# Patient Record
Sex: Female | Born: 1985 | Race: White | Hispanic: No | Marital: Single | Smoking: Current every day smoker
Health system: Southern US, Community
[De-identification: ages and names within clinical notes are randomized; demographics above are authoritative.]

---
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
## 2004-08-04 ENCOUNTER — Emergency Department (HOSPITAL_COMMUNITY): Admission: EM | Admit: 2004-08-04 | Discharge: 2004-08-04 | Payer: Self-pay | Admitting: Emergency Medicine

## 2010-01-06 ENCOUNTER — Emergency Department (HOSPITAL_COMMUNITY): Admission: EM | Admit: 2010-01-06 | Discharge: 2010-01-06 | Payer: Self-pay | Admitting: Emergency Medicine

## 2010-08-02 NOTE — Consult Note (Signed)
[facility]  Patient:    Diane Franklin, Diane Franklin Visit Number: [id number] MRN: [id number]          Service Type: EMS Location: ED Attending Physician:  Cassell Smiles. Dictated by:   Langley Gauss, M.D. Proc. Date: 01/24/01 Admit Date:  01/24/2001   CC:         Elfredia Nevins, M.D., [facility]   Consultation Report  REASON FOR CONSULTATION:  This is a 25 year old gravida 1, para 0, with a last menstrual period at the end of September or early part of October, who presents to the emergency room on todays date, presenting of uterine cramping and vaginal bleeding x 3 days duration.  She describes the uterine cramping and bleeding as being heavier than typical menses.  This actually subsided somewhat.  Menstrual periods are typically irregular.  She states that the bleeding and cramping were very severe Friday, forcing her to leave school early, then again today, the bleeding was very heavy with increased cramping, which have markedly improved upon my evaluation.  Patient states she is status post several small clots about quarter-sized.  She also describes passage of a long stringy clot x 1.  SOCIAL HISTORY:  Patients mother is very upset.  She obviously is unaware of the patients sexual activity and today was the first finding of a positive pregnancy test.  Patient is a 10th grade student at ______ [employer].  MEDICAL HISTORY:  No prior medical or surgical history.  ALLERGIES:  No known drug allergies.  PHYSICAL EXAMINATION:  GENERAL:  Patient in no acute distress, somewhat tearful, as is her mother.  VITAL SIGNS:  Temperature 97.7, pulse of 75, respiratory rate is 18, blood pressure is 109/50.  HEENT:  Negative.  NECK:  No adenopathy.  Neck is supple.  Thyroid is nonpalpable.  LUNGS:  Clear.  CARDIOVASCULAR:  Regular rate and rhythm.  ABDOMEN:  Soft and nontender.  No surgical scars are identified.  EXTREMITIES:   Normal.  PELVIC:  Exam reveals a small amount of vaginal bleeding present on the inner thighs.  Sterile speculum examination is performed.  There is a membranous-appearing tissue present within vaginal vault.  This is teased away until completely removed from the endocervix.  A culture is then performed of the endocervical os.  The endocervical os is noted to be closed.  Bimanual examination reveals a normal-sized uterus, nontender, no adnexal masses.  LABORATORY STUDIES:  A quantitative beta hCG is 781.  Blood type is AB-positive.  Electrolytes within normal limits.  Serum hCG is positive. Hemoglobin 13.7, hematocrit 39.5, white blood cell count of 8.4.  Urinalysis pertinent only for moderate hemoglobin.  ASSESSMENT:  Early intrauterine pregnancy, first trimester, best estimated gestational age would be 5 to 6 weeks, patient now with vaginal bleeding and cramping x 3 days duration, markedly decreased at this point in time with passage in the emergency room of what appears to be tissue.  This is sent off for products of conception.  Signs and symptoms of complete spontaneous abortion are reviewed with the patient and her mother.  Should the bleeding or cramping increase, they may require reevaluation to include possible ultrasound, repeat quantitative human chorionic gonadotropin and dilatation and curettage if clinically indicated.  Otherwise, will follow up at the office in two weeks time.  Did discuss briefly with the patient and her mother that the initiation of birth control may be desirable at that time. All questions are answered.  Patient discharged home at this  time.  Patient initially seen in the emergency room by Dr. Elfredia Nevins, who did the preliminary evaluation and then consulted me for continuing evaluation and management of this the patient. Dictated by:   Langley Gauss, M.D. Attending Physician:  Cassell Smiles DD:  01/24/01 TD:  01/25/01 Job:  [id number]

## 2011-09-09 ENCOUNTER — Ambulatory Visit (HOSPITAL_COMMUNITY)
Admission: RE | Admit: 2011-09-09 | Discharge: 2011-09-09 | Disposition: A | Discharge status: Home / Self Care | Payer: 59 | Source: Ambulatory Visit | Attending: Family Medicine | Admitting: Family Medicine

## 2011-09-09 ENCOUNTER — Other Ambulatory Visit: Payer: Self-pay | Admitting: Family Medicine

## 2011-09-09 DIAGNOSIS — Z9289 Personal history of other medical treatment: Secondary | ICD-10-CM

## 2011-09-09 DIAGNOSIS — R042 Hemoptysis: Secondary | ICD-10-CM | POA: Insufficient documentation

## 2011-09-09 DIAGNOSIS — Z228 Carrier of other infectious diseases: Secondary | ICD-10-CM | POA: Insufficient documentation

## 2011-09-24 ENCOUNTER — Other Ambulatory Visit: Payer: Self-pay | Admitting: Family Medicine

## 2011-09-24 DIAGNOSIS — R042 Hemoptysis: Secondary | ICD-10-CM

## 2011-10-01 ENCOUNTER — Ambulatory Visit (HOSPITAL_COMMUNITY)
Admission: RE | Admit: 2011-10-01 | Discharge: 2011-10-01 | Disposition: A | Discharge status: Home / Self Care | Payer: 59 | Source: Ambulatory Visit | Attending: Family Medicine | Admitting: Family Medicine

## 2011-10-01 DIAGNOSIS — R042 Hemoptysis: Secondary | ICD-10-CM

## 2011-10-01 MED ORDER — IOHEXOL 300 MG/ML  SOLN
80.0000 mL | Freq: Once | INTRAMUSCULAR | Status: AC | PRN
  Administered 2011-10-01: 80 mL via INTRAVENOUS

## 2012-12-01 ENCOUNTER — Ambulatory Visit (INDEPENDENT_AMBULATORY_CARE_PROVIDER_SITE_OTHER): Payer: BC Managed Care – PPO | Admitting: Nurse Practitioner

## 2012-12-01 ENCOUNTER — Encounter: Payer: Self-pay | Admitting: Family Medicine

## 2012-12-01 ENCOUNTER — Encounter: Payer: Self-pay | Admitting: Nurse Practitioner

## 2012-12-01 VITALS — BP 108/68 | Temp 98.7°F | Ht 62.0 in | Wt 118.8 lb

## 2012-12-01 DIAGNOSIS — J322 Chronic ethmoidal sinusitis: Secondary | ICD-10-CM

## 2012-12-01 MED ORDER — AZITHROMYCIN 250 MG PO TABS: ORAL_TABLET | ORAL | Status: DC

## 2012-12-01 NOTE — Patient Instructions (Signed)
Loratadine 10 mg once in the morning Benadryl at night Nasacort AQ as directed

## 2012-12-03 ENCOUNTER — Encounter: Payer: Self-pay | Admitting: Nurse Practitioner

## 2012-12-03 ENCOUNTER — Encounter: Payer: Self-pay | Admitting: Family Medicine

## 2012-12-03 NOTE — [facility] Notes (Signed)
Subjective:  Presents for complaints of ethmoid sinus area pressure over the past week. Patient is a smoker. No fever. No cough. No sore throat ear pain. Has been taking Benadryl.  Objective:   BP 108/68  Temp(Src) 98.7 F (37.1 C)  Ht 5\' 2"  (1.575 m)  Wt 118 lb 12.8 oz (53.887 kg)  BMI 21.72 kg/m2 NAD. Alert, oriented. TMs clear effusion, no erythema. Pharynx injected was slightly green PND noted. Neck supple mild soft nontender adenopathy. Lungs clear. Heart regular rate rhythm.  Assessment: Ethmoid sinusitis  Plan:. Meds ordered this encounter  Medications  . azithromycin (ZITHROMAX Z-PAK) 250 MG tablet    Sig: Take 2 tablets (500 mg) on  Day 1,  followed by 1 tablet (250 mg) once daily on Days 2 through 5.    Dispense:  6 each    Refill:  0    Order Specific Question:  Supervising Provider    Answer:  Merlyn Albert [[id number]]   OTC as directed. Call back if worsens or persists.

## 2013-01-25 ENCOUNTER — Other Ambulatory Visit: Payer: Self-pay | Admitting: *Deleted

## 2013-01-25 ENCOUNTER — Telehealth: Payer: Self-pay | Admitting: *Deleted

## 2013-01-25 MED ORDER — ONDANSETRON HCL 8 MG PO TABS: 8.0000 mg | ORAL_TABLET | Freq: Three times a day (TID) | ORAL | Status: DC | PRN

## 2013-01-25 NOTE — Telephone Encounter (Signed)
Called in Zofran for pt. Told her to get a decongestant as well. Told her to call back if symptoms got worst. Pt verbalized understanding.

## 2013-01-26 ENCOUNTER — Encounter: Payer: Self-pay | Admitting: Family Medicine

## 2013-01-26 ENCOUNTER — Ambulatory Visit (INDEPENDENT_AMBULATORY_CARE_PROVIDER_SITE_OTHER): Payer: BC Managed Care – PPO | Admitting: Family Medicine

## 2013-01-26 VITALS — BP 118/60 | Temp 99.1°F | Ht 62.0 in | Wt 117.0 lb

## 2013-01-26 DIAGNOSIS — J069 Acute upper respiratory infection, unspecified: Secondary | ICD-10-CM

## 2013-01-26 DIAGNOSIS — A084 Viral intestinal infection, unspecified: Secondary | ICD-10-CM

## 2013-01-26 DIAGNOSIS — A088 Other specified intestinal infections: Secondary | ICD-10-CM

## 2013-01-26 NOTE — Patient Instructions (Signed)
You Can Quit Smoking If you are ready to quit smoking or are thinking about it, congratulations! You have chosen to help yourself be healthier and live longer! There are lots of different ways to quit smoking. Nicotine gum, nicotine patches, a nicotine inhaler, or nicotine nasal spray can help with physical craving. Hypnosis, support groups, and medicines help break the habit of smoking. TIPS TO GET OFF AND STAY OFF CIGARETTES  Learn to predict your moods. Do not let a bad situation be your excuse to have a cigarette. Some situations in your life might tempt you to have a cigarette.  Ask friends and co-workers not to smoke around you.  Make your home smoke-free.  Never have "just one" cigarette. It leads to wanting another and another. Remind yourself of your decision to quit.  On a card, make a list of your reasons for not smoking. Read it at least the same number of times a day as you have a cigarette. Tell yourself everyday, "I do not want to smoke. I choose not to smoke."  Ask someone at home or work to help you with your plan to quit smoking.  Have something planned after you eat or have a cup of coffee. Take a walk or get other exercise to perk you up. This will help to keep you from overeating.  Try a relaxation exercise to calm you down and decrease your stress. Remember, you may be tense and nervous the first two weeks after you quit. This will pass.  Find new activities to keep your hands busy. Play with a pen, coin, or rubber band. Doodle or draw things on paper.  Brush your teeth right after eating. This will help cut down the craving for the taste of tobacco after meals. You can try mouthwash too.  Try gum, breath mints, or diet candy to keep something in your mouth. IF YOU SMOKE AND WANT TO QUIT:  Do not stock up on cigarettes. Never buy a carton. Wait until one pack is finished before you buy another.  Never carry cigarettes with you at work or at home.  Keep cigarettes  as far away from you as possible. Leave them with someone else.  Never carry matches or a lighter with you.  Ask yourself, "Do I need this cigarette or is this just a reflex?"  Bet with someone that you can quit. Put cigarette money in a piggy bank every morning. If you smoke, you give up the money. If you do not smoke, by the end of the week, you keep the money.  Keep trying. It takes 21 days to change a habit!  Talk to your doctor about using medicines to help you quit. These include nicotine replacement gum, lozenges, or skin patches. Document Released: 12/28/2008 Document Revised: 05/26/2011 Document Reviewed: 12/28/2008 [employer] Patient Information 2014 [employer].  

## 2013-01-26 NOTE — [facility] Notes (Signed)
  Subjective:    Patient ID: Diane Franklin, female    DOB: [birth year], 27 y.o.   MRN: [id number]  Cough This is a new problem. The current episode started in the past 7 days. Associated symptoms include a fever and headaches. Associated symptoms comments: Vomiting, diarrhea, abdominal pain. Treatments tried: mucinex.   patient relates start off head congestion To nausea vomiting and diarrhea pay congestion and not quite as bad. Past medical history benign   Review of Systems  Constitutional: Positive for fever.  Respiratory: Positive for cough.   Neurological: Positive for headaches.       Objective:   Physical Exam Eardrums are normal throat is normal neck is supple lungs are clear head congestion noted mild sinus tenderness in       Assessment & Plan:  Viral gastroenteritis should resolve on its own Up rest real this could roll into a sinus infection warning signs were discussed if any symptoms of this call us we will call in antibiotic Zofran as needed for nausea Rehydration techniques discussed Warning signs discussed

## 2013-08-21 ENCOUNTER — Encounter (HOSPITAL_COMMUNITY): Payer: Self-pay | Admitting: Emergency Medicine

## 2013-08-21 ENCOUNTER — Emergency Department (HOSPITAL_COMMUNITY): Payer: No Typology Code available for payment source

## 2013-08-21 ENCOUNTER — Emergency Department (HOSPITAL_COMMUNITY)
Admission: EM | Admit: 2013-08-21 | Discharge: 2013-08-21 | Disposition: A | Discharge status: Home / Self Care | Payer: No Typology Code available for payment source | Attending: Emergency Medicine | Admitting: Emergency Medicine

## 2013-08-21 DIAGNOSIS — Z3202 Encounter for pregnancy test, result negative: Secondary | ICD-10-CM | POA: Insufficient documentation

## 2013-08-21 DIAGNOSIS — F17200 Nicotine dependence, unspecified, uncomplicated: Secondary | ICD-10-CM | POA: Insufficient documentation

## 2013-08-21 DIAGNOSIS — R0789 Other chest pain: Secondary | ICD-10-CM

## 2013-08-21 DIAGNOSIS — Z791 Long term (current) use of non-steroidal anti-inflammatories (NSAID): Secondary | ICD-10-CM | POA: Insufficient documentation

## 2013-08-21 DIAGNOSIS — R071 Chest pain on breathing: Secondary | ICD-10-CM | POA: Insufficient documentation

## 2013-08-21 DIAGNOSIS — R0602 Shortness of breath: Secondary | ICD-10-CM | POA: Insufficient documentation

## 2013-08-21 LAB — BASIC METABOLIC PANEL
BUN: 11 mg/dL (ref 6–23)
CHLORIDE: 101 meq/L (ref 96–112)
CO2: 26 mEq/L (ref 19–32)
Calcium: 9.2 mg/dL (ref 8.4–10.5)
Creatinine, Ser: 0.73 mg/dL (ref 0.50–1.10)
Glucose, Bld: 94 mg/dL (ref 70–99)
POTASSIUM: 3.8 meq/L (ref 3.7–5.3)
Sodium: 139 mEq/L (ref 137–147)

## 2013-08-21 LAB — URINALYSIS, ROUTINE W REFLEX MICROSCOPIC
Bilirubin Urine: NEGATIVE
Glucose, UA: NEGATIVE mg/dL
Ketones, ur: NEGATIVE mg/dL
LEUKOCYTES UA: NEGATIVE
NITRITE: NEGATIVE
PH: 7 (ref 5.0–8.0)
Protein, ur: NEGATIVE mg/dL
Specific Gravity, Urine: 1.01 (ref 1.005–1.030)
UROBILINOGEN UA: 1 mg/dL (ref 0.0–1.0)

## 2013-08-21 LAB — URINE MICROSCOPIC-ADD ON

## 2013-08-21 LAB — CBC
HCT: 40.7 % (ref 36.0–46.0)
HEMOGLOBIN: 14.1 g/dL (ref 12.0–15.0)
MCH: 32.4 pg (ref 26.0–34.0)
MCHC: 34.6 g/dL (ref 30.0–36.0)
MCV: 93.6 fL (ref 78.0–100.0)
Platelets: 251 10*3/uL (ref 150–400)
RBC: 4.35 MIL/uL (ref 3.87–5.11)
RDW: 12.2 % (ref 11.5–15.5)
WBC: 8.3 10*3/uL (ref 4.0–10.5)

## 2013-08-21 LAB — D-DIMER, QUANTITATIVE: D-Dimer, Quant: <0.27 ug/mL-FEU (ref 0.00–0.48)

## 2013-08-21 LAB — POC URINE PREG, ED: PREG TEST UR: NEGATIVE

## 2013-08-21 MED ORDER — NAPROXEN 500 MG PO TABS: 500.0000 mg | ORAL_TABLET | Freq: Two times a day (BID) | ORAL | Status: DC

## 2013-08-21 NOTE — Discharge Instructions (Signed)
Chest Wall Pain Chest wall pain is pain felt in or around the chest bones and muscles. It may take up to 6 weeks to get better. It may take longer if you are active. Chest wall pain can happen on its own. Other times, things like germs, injury, coughing, or exercise can cause the pain. HOME CARE   Avoid activities that make you tired or cause pain. Try not to use your chest, belly (abdominal), or side muscles. Do not use heavy weights.  Put ice on the sore area.  Put ice in a plastic bag.  Place a towel between your skin and the bag.  Leave the ice on for 15-20 minutes for the first 2 days.  Only take medicine as told by your doctor. GET HELP RIGHT AWAY IF:   You have more pain or are very uncomfortable.  You have a fever.  Your chest pain gets worse.  You have new problems.  You feel sick to your stomach (nauseous) or throw up (vomit).  You start to sweat or feel lightheaded.  You have a cough with mucus (phlegm).  You cough up blood. MAKE SURE YOU:   Understand these instructions.  Will watch your condition.  Will get help right away if you are not doing well or get worse. Document Released: 08/20/2007 Document Revised: 05/26/2011 Document Reviewed: 10/28/2010 [facility] Patient Information 2014 [place], [place].  As we discussed workup for the chest wall pain was negative for any evidence of a blood clot in the lungs or collapse of the lung or pneumonia. Will treat as a chest wall muscular type pain. Take the Naprosyn as directed for the next 7 days. If not improved follow up with your regular Dr. Return for new or worse symptoms.

## 2013-08-21 NOTE — ED Notes (Signed)
Pain in right upper back for the past 2 weeks, hurts to breathe, states she was wheezing this am

## 2013-08-21 NOTE — ED Provider Notes (Signed)
CSN: [id number]     Arrival date & time 08/21/13  1301 History   First MD Initiated Contact with Patient 08/21/13 1510     Chief Complaint  Patient presents with  . Back Pain     (Consider location/radiation/quality/duration/timing/severity/associated sxs/prior Treatment) Patient is a 28 y.o. female presenting with back pain. The history is provided by the patient.  Back Pain Associated symptoms: chest pain   Associated symptoms: no abdominal pain, no dysuria, no fever and no headaches    patient with complaint of two-week history of right upper back pain made worse with coughing made worse with taking a deep breath. Was preceded by an upper respiratory infection this lowers of. Patient is having some shortness of breath. No fevers. No history of similar problem. No swelling to her legs. Patient states that the pain is 10 out 10 when she coughs or takes a deep breath otherwise [place] 6/10. Sharp and aching nature.  History reviewed. No pertinent past medical history. History reviewed. No pertinent past surgical history. No family history on file. History  Substance Use Topics  . Smoking status: Current Every Day Smoker -- 0.50 packs/day    Types: Cigarettes  . Smokeless tobacco: Not on file  . Alcohol Use: Not on file   OB History   Grav Para Term Preterm Abortions TAB SAB Ect Mult Living                 Review of Systems  Constitutional: Negative for fever.  HENT: Negative for congestion.   Eyes: Negative for visual disturbance.  Respiratory: Positive for shortness of breath.   Cardiovascular: Positive for chest pain.  Gastrointestinal: Negative for abdominal pain.  Genitourinary: Negative for dysuria.  Musculoskeletal: Negative for back pain.  Neurological: Negative for headaches.  Hematological: Does not bruise/bleed easily.  Psychiatric/Behavioral: Negative for confusion.      Allergies  Review of patient's allergies indicates no known allergies.  Home Medications    Prior to Admission medications   Medication Sig Start Date End Date Taking? Authorizing Provider  naproxen (NAPROSYN) 500 MG tablet Take 1 tablet (500 mg total) by mouth 2 (two) times daily. 08/21/13   Vanetta Mulders, MD   BP 98/68  Pulse 75  Temp(Src) 98.8 F (37.1 C) (Oral)  Resp 18  Ht 5\' 2"  (1.575 m)  Wt 118 lb (53.524 kg)  BMI 21.58 kg/m2  SpO2 99%  LMP 08/15/2013 Physical Exam  Nursing note and vitals reviewed. Constitutional: She is oriented to person, place, and time. She appears well-developed and well-nourished. No distress.  HENT:  Head: Normocephalic and atraumatic.  Mouth/Throat: Oropharynx is clear and moist.  Eyes: Conjunctivae and EOM are normal. Pupils are equal, round, and reactive to light.  Neck: Normal range of motion.  Pulmonary/Chest: Effort normal and breath sounds normal. No respiratory distress. She exhibits no tenderness.  Abdominal: Soft. Bowel sounds are normal. There is no tenderness.  Musculoskeletal: Normal range of motion. She exhibits no tenderness.  Neurological: She is alert and oriented to person, place, and time. No cranial nerve deficit. She exhibits normal muscle tone. Coordination normal.  Skin: Skin is warm. No rash noted.    ED Course  Procedures (including critical care time) Labs Review Labs Reviewed  URINALYSIS, ROUTINE W REFLEX MICROSCOPIC - Abnormal; Notable for the following:    APPearance HAZY (*)    Hgb urine dipstick MODERATE (*)    All other components within normal limits  URINE MICROSCOPIC-ADD ON - Abnormal; Notable for the  following:    Squamous Epithelial / LPF FEW (*)    All other components within normal limits  CBC  BASIC METABOLIC PANEL  D-DIMER, QUANTITATIVE  POC URINE PREG, ED    Imaging Review Dg Chest 2 View  08/21/2013   CLINICAL DATA:  Pleuritic chest pain and shortness of breath.  EXAM: CHEST  2 VIEW  COMPARISON:  09/09/2011  FINDINGS: The heart size and mediastinal contours are within normal limits.  Both lungs are clear. The visualized skeletal structures are unremarkable.  IMPRESSION: No active cardiopulmonary disease.   Electronically Signed   By: Elberta Fortisaniel  Boyle M.D.   On: 08/21/2013 15:42     EKG Interpretation None      MDM   Final diagnoses:  Chest wall pain    Workup for the right upper back pain.negative for pneumothorax pneumonia pleural effusion pulmonary embolus. D-dimer was negative. We'll treat his chest wall pain with Naprosyn.    Vanetta MuldersScott Charli Liberatore, MD 08/21/13 [phone]

## 2013-08-21 NOTE — ED Notes (Signed)
Pt alert & oriented x4, stable gait. Patient given discharge instructions, paperwork & prescription(s). Patient  instructed to stop at the registration desk to finish any additional paperwork. Patient verbalized understanding. Pt left department w/ no further questions. 

## 2013-08-26 ENCOUNTER — Encounter (HOSPITAL_COMMUNITY): Payer: Self-pay | Admitting: Emergency Medicine

## 2013-08-26 ENCOUNTER — Emergency Department (HOSPITAL_COMMUNITY): Payer: No Typology Code available for payment source

## 2013-08-26 ENCOUNTER — Emergency Department (HOSPITAL_COMMUNITY)
Admission: EM | Admit: 2013-08-26 | Discharge: 2013-08-26 | Disposition: A | Discharge status: Home / Self Care | Payer: No Typology Code available for payment source | Attending: Emergency Medicine | Admitting: Emergency Medicine

## 2013-08-26 DIAGNOSIS — F17200 Nicotine dependence, unspecified, uncomplicated: Secondary | ICD-10-CM | POA: Insufficient documentation

## 2013-08-26 DIAGNOSIS — R0789 Other chest pain: Secondary | ICD-10-CM

## 2013-08-26 DIAGNOSIS — Z791 Long term (current) use of non-steroidal anti-inflammatories (NSAID): Secondary | ICD-10-CM | POA: Insufficient documentation

## 2013-08-26 DIAGNOSIS — R071 Chest pain on breathing: Secondary | ICD-10-CM | POA: Insufficient documentation

## 2013-08-26 MED ORDER — HYDROCODONE-ACETAMINOPHEN 5-325 MG PO TABS: 2.0000 | ORAL_TABLET | ORAL | Status: DC | PRN

## 2013-08-26 NOTE — ED Notes (Signed)
Patient with no complaints at this time. Respirations even and unlabored. Skin warm/dry. Discharge instructions reviewed with patient at this time. Patient given opportunity to voice concerns/ask questions. Patient discharged at this time and left Emergency Department with steady gait.   

## 2013-08-26 NOTE — ED Notes (Signed)
Pain rt post upper thorax, Today coughed and felt pain and felt faint.    Seen here 6/7

## 2013-08-26 NOTE — ED Provider Notes (Signed)
CSN: [id number]     Arrival date & time 08/26/13  1710 History   First MD Initiated Contact with Patient 08/26/13 1815     Chief Complaint  Patient presents with  . Cough     (Consider location/radiation/quality/duration/timing/severity/associated sxs/prior Treatment) Patient is a 28 y.o. female presenting with chest pain. The history is provided by the patient. No language interpreter was used.  Chest Pain Pain location:  R chest Pain quality: aching   Pain radiates to:  Does not radiate Pain radiates to the back: no   Pain severity:  Severe Onset quality:  Sudden Timing:  Constant Progression:  Worsening Chronicity:  New Relieved by:  Nothing Worsened by:  Nothing tried Associated symptoms: no weakness     History reviewed. No pertinent past medical history. History reviewed. No pertinent past surgical history. History reviewed. No pertinent family history. History  Substance Use Topics  . Smoking status: Current Every Day Smoker -- 0.50 packs/day    Types: Cigarettes  . Smokeless tobacco: Not on file  . Alcohol Use: Yes   OB History   Grav Para Term Preterm Abortions TAB SAB Ect Mult Living                 Review of Systems  Cardiovascular: Positive for chest pain.  Neurological: Negative for weakness.  All other systems reviewed and are negative.     Allergies  Review of patient's allergies indicates no known allergies.  Home Medications   Prior to Admission medications   Medication Sig Start Date End Date Taking? Authorizing Provider  naproxen (NAPROSYN) 500 MG tablet Take 1 tablet (500 mg total) by mouth 2 (two) times daily. 08/21/13  Yes Vanetta MuldersScott Zackowski, MD   BP 113/60  Pulse 65  Temp(Src) 97.3 F (36.3 C) (Oral)  Resp 18  Ht 5\' 2"  (1.575 m)  Wt 118 lb (53.524 kg)  BMI 21.58 kg/m2  SpO2 100%  LMP 08/15/2013 Physical Exam  Nursing note and vitals reviewed. Constitutional: She is oriented to person, place, and time. She appears well-developed  and well-nourished.  HENT:  Head: Normocephalic and atraumatic.  Eyes: EOM are normal. Pupils are equal, round, and reactive to light.  Neck: Normal range of motion.  Cardiovascular: Normal rate and regular rhythm.   Pulmonary/Chest: Effort normal and breath sounds normal.  Abdominal: Soft. She exhibits no distension.  Musculoskeletal: Normal range of motion.  Neurological: She is alert and oriented to person, place, and time.  Skin: Skin is warm.  Psychiatric: She has a normal mood and affect.    ED Course  Procedures (including critical care time) Labs Review Labs Reviewed - No data to display  Imaging Review Dg Chest 2 View  08/26/2013   CLINICAL DATA:  Cough.  EXAM: CHEST  2 VIEW  COMPARISON:  None.  FINDINGS: Mediastinum and hilar structures normal. Lungs are clear. Heart size normal. No acute bony abnormality .  IMPRESSION: No acute cardiopulmonary disease.   Electronically Signed   By: Maisie Fushomas  Register   On: 08/26/2013 18:50     EKG Interpretation None      MDM   Final diagnoses:  Chest wall pain    rx for pain medication,  Follow up with your Physicain for recheck next week    Elson AreasLeslie K Sofia, PA-C 08/26/13 1915

## 2013-08-26 NOTE — ED Notes (Signed)
Patient states she coughed earlier and felt a "pop" in R posterior thoracic area.  C/O pain w/deep breath and twisting, but states pain is much better now than when she got here.

## 2013-08-26 NOTE — ED Provider Notes (Signed)
Medical screening examination/treatment/procedure(s) were performed by non-physician practitioner and as supervising physician I was immediately available for consultation/collaboration.   EKG Interpretation None        Gilda Creasehristopher J. Shynia Daleo, MD 08/26/13 1925

## 2017-05-02 ENCOUNTER — Other Ambulatory Visit: Payer: Self-pay

## 2017-05-02 ENCOUNTER — Encounter (HOSPITAL_COMMUNITY): Payer: Self-pay | Admitting: Emergency Medicine

## 2017-05-02 ENCOUNTER — Emergency Department (HOSPITAL_COMMUNITY)
Admission: EM | Admit: 2017-05-02 | Discharge: 2017-05-02 | Disposition: A | Discharge status: Home / Self Care | Payer: Self-pay | Attending: Emergency Medicine | Admitting: Emergency Medicine

## 2017-05-02 ENCOUNTER — Emergency Department (HOSPITAL_COMMUNITY): Payer: Self-pay

## 2017-05-02 DIAGNOSIS — Y990 Civilian activity done for income or pay: Secondary | ICD-10-CM | POA: Insufficient documentation

## 2017-05-02 DIAGNOSIS — Y9289 Other specified places as the place of occurrence of the external cause: Secondary | ICD-10-CM | POA: Insufficient documentation

## 2017-05-02 DIAGNOSIS — F17210 Nicotine dependence, cigarettes, uncomplicated: Secondary | ICD-10-CM | POA: Insufficient documentation

## 2017-05-02 DIAGNOSIS — S93401A Sprain of unspecified ligament of right ankle, initial encounter: Secondary | ICD-10-CM | POA: Insufficient documentation

## 2017-05-02 DIAGNOSIS — X500XXA Overexertion from strenuous movement or load, initial encounter: Secondary | ICD-10-CM | POA: Insufficient documentation

## 2017-05-02 DIAGNOSIS — Y9389 Activity, other specified: Secondary | ICD-10-CM | POA: Insufficient documentation

## 2017-05-02 LAB — POC URINE PREG, ED: PREG TEST UR: NEGATIVE

## 2017-05-02 MED ORDER — IBUPROFEN 400 MG PO TABS
600.0000 mg | ORAL_TABLET | Freq: Once | ORAL | Status: AC
  Administered 2017-05-02: 600 mg via ORAL
  Filled 2017-05-02: qty 2

## 2017-05-02 MED ORDER — IBUPROFEN 600 MG PO TABS: 600.0000 mg | ORAL_TABLET | Freq: Four times a day (QID) | ORAL | 0 refills | Status: AC | PRN

## 2017-05-02 NOTE — ED Provider Notes (Signed)
[facility] EMERGENCY DEPARTMENT Provider Note   CSN: [id number] Arrival date & time: 05/02/17  1014     History   Chief Complaint Chief Complaint  Patient presents with  . Ankle Pain    Right    HPI Diane Franklin is a 32 y.o. female.  HPI   Patient is a 32 year old female with no significant past medical history presenting for right ankle injury.  Patient reports she was at work this morning and stepped off to doors that were stacked on top of each other and twisted her ankle.  Patient was able to immediately walk on it, however in the process of retiring her high top tennis shoes back on, she re-twisted her ankle and felt a "pop".  Patient was not able to walk on it subsequently.  No other trauma sustained in this event.  Patient reports noticing swelling over the lateral aspect of her right ankle.  Patient denies weakness, numbness, ecchymosis, or erythema to the joint.  No remedies tried prior to arrival.  History reviewed. No pertinent past medical history.  There are no active problems to display for this patient.   History reviewed. No pertinent surgical history.  OB History    No data available       Home Medications    Prior to Admission medications   Medication Sig Start Date End Date Taking? Authorizing Provider  HYDROcodone-acetaminophen (NORCO/VICODIN) 5-325 MG per tablet Take 2 tablets by mouth every 4 (four) hours as needed. 08/26/13   Elson Areas, PA-C  naproxen (NAPROSYN) 500 MG tablet Take 1 tablet (500 mg total) by mouth 2 (two) times daily. 08/21/13   Vanetta Mulders, MD    Family History History reviewed. No pertinent family history.  Social History Social History   Tobacco Use  . Smoking status: Current Every Day Smoker    Packs/day: 1.00    Types: Cigarettes  . Smokeless tobacco: Never Used  Substance Use Topics  . Alcohol use: Yes    Comment: 3-4X week  . Drug use: Yes    Frequency: 7.0 times per week    Types: Marijuana      Allergies   Patient has no known allergies.   Review of Systems Review of Systems  Musculoskeletal: Positive for arthralgias and joint swelling.  Skin: Negative for wound.  Neurological: Negative for syncope, weakness and numbness.     Physical Exam Updated Vital Signs BP 106/67 (BP Location: Right Arm)   Pulse 69   Temp 98 F (36.7 C) (Oral)   Resp 18   Ht 5\' 2"  (1.575 m)   Wt 53.1 kg (117 lb)   LMP 04/20/2017   SpO2 100%   BMI 21.40 kg/m   Physical Exam  Constitutional: She appears well-developed and well-nourished. No distress.  Sitting comfortably in bed.  HENT:  Head: Normocephalic and atraumatic.  Eyes: Conjunctivae are normal. Right eye exhibits no discharge. Left eye exhibits no discharge.  EOMs normal to gross examination.  Neck: Normal range of motion.  Cardiovascular: Normal rate and regular rhythm.  Intact, 2+ DP and PT pulses of the right lower extremity.  Equal to left.  Pulmonary/Chest:  Normal respiratory effort. Patient converses comfortably. No audible wheeze or stridor.  Abdominal: She exhibits no distension.  Musculoskeletal: Normal range of motion.  Right ankle with tenderness to palpation of lateral malleolus. Swelling around lateral malleolus.  Decreased range of motion with flexion, extension, inversion, eversion due to pain.No erythema, ecchymosis, or deformity appreciated. No break  in skin. No pain to fifth metatarsal area or navicular region. Good pedal pulse and cap refill of toes. Sensation intact to light touch distally.   Neurological: She is alert.  Cranial nerves intact to gross observation. Patient moves extremities without difficulty.  Skin: Skin is warm and dry. She is not diaphoretic.  Psychiatric: She has a normal mood and affect. Her behavior is normal. Judgment and thought content normal.  Nursing note and vitals reviewed.    ED Treatments / Results  Labs (all labs ordered are listed, but only abnormal results are  displayed) Labs Reviewed - No data to display  EKG  EKG Interpretation None       Radiology Dg Ankle Complete Right  Result Date: 05/02/2017 CLINICAL DATA:  Twisting injury, lateral swelling and pain EXAM: RIGHT ANKLE - COMPLETE 3+ VIEW COMPARISON:  None available FINDINGS: Mild soft tissue swelling, more pronounced laterally. Normal alignment without acute osseous finding or fracture. No joint abnormality. IMPRESSION: Lateral soft tissue swelling without acute osseous finding Electronically Signed   By: Judie PetitM.  Shick M.D.   On: 05/02/2017 10:55    Procedures Procedures (including critical care time)  Medications Ordered in ED Medications  ibuprofen (ADVIL,MOTRIN) tablet 600 mg (not administered)     Initial Impression / Assessment and Plan / ED Course  I have reviewed the triage vital signs and the nursing notes.  Pertinent labs & imaging results that were available during my care of the patient were reviewed by me and considered in my medical decision making (see chart for details).     Patient is well-appearing and in no acute distress.  X-ray without bony abnormality or evidence of ankle fracture.  Clinical evidence of ankle sprain, suspect ATL ligament.  I instructed patient on RICE therapy and WBAT therapy.  Aircast and crutches dispensed.  Patient given orthopedic follow-up and instructed to establish care with primary care provider.  Return precautions given for any increasing pain, pallor, paresthesias, or pulses in the right lower extremity.  Patient is in understanding and agrees with the plan of care.  Final Clinical Impressions(s) / ED Diagnoses   Final diagnoses:  Sprain of right ankle, unspecified ligament, initial encounter    ED Discharge Orders    None       Delia ChimesMurray, Ashe Graybeal B, PA-C 05/02/17 1801    Margarita Grizzleay, Danielle, MD 05/03/17 [phone]

## 2017-05-02 NOTE — ED Triage Notes (Signed)
Pt reports twisting her R ankle early this morning and states she cannot bear weight on it. No OTC medicines.

## 2017-05-02 NOTE — Discharge Instructions (Signed)
Please see the information and instructions below regarding your visit.  Your diagnoses today include:  1. Sprain of right ankle, unspecified ligament, initial encounter    Your provider has diagnosed you as suffering from an ankle sprain. Ankle sprain occurs when the ligaments that hold the ankle joint together are stretched or torn. It may take 4 to 6 weeks to heal.  Tests performed today include: An x-ray of your ankle - does NOT show any broken bones  See side panel of your discharge paperwork for testing performed today. Vital signs are listed at the bottom of these instructions.   Medications prescribed:  Take any prescribed medications only as prescribed, and any over the counter medications only as directed on the packaging.  You are prescribed ibuprofen, a non-steroidal anti-inflammatory agent (NSAID) for pain. You may take 600mg  every 6 hours as needed for pain. If still requiring this medication around the clock for acute pain after 10 days, please see your primary healthcare provider.  Women who are pregnant, breastfeeding, or planning on becoming pregnant should not take non-steroidal anti-inflammatories such as   You may combine this medication with Tylenol, 650 mg every 6 hours, so you are receiving something for pain every 3 hours.  This is not a long-term medication unless under the care and direction of your primary provider. Taking this medication long-term and not under the supervision of a healthcare provider could increase the risk of stomach ulcers, kidney problems, and cardiovascular problems such as high blood pressure.    Home care instructions:  Follow R.I.C.E. Protocol: R - rest your injury  I  - use ice on injury without applying directly to skin C - compress injury with bandage or splint E - elevate the injury as much as possible  For Activity: Wear ankle brace for at least 2 weeks for stabilization of ankle. If prescribed crutches, use crutches with  non-weight bearing for the first few days. Then, you may walk on your ankle as the pain allows, or as instructed. Start gradually with weight bearing on the affected ankle. Once you can walk pain free, then try jogging. When you can run forwards, then you can try moving side-to-side. If you cannot walk without crutches in one week, you need a re-check.  Please follow any educational materials contained in this packet.   Follow-up instructions: Please follow-up with your primary care provider or the provided orthopedic (bone specialist) listed in this packet if you continue to have significant pain or trouble walking in 1 week. In this case you may have a severe sprain that requires further care.   Return instructions:  Please return if your toes are numb or tingling, appear gray or blue, are much colder than your other foot, or you have severe pain (also elevate leg and loosen splint or wrap). Please return to the Emergency Department if you experience worsening symptoms.  Please return if you have any other emergent concerns.  Additional Information:  Your vital signs today were: BP 106/67 (BP Location: Right Arm)    Pulse 69    Temp 98 F (36.7 C) (Oral)    Resp 18    Ht 5\' 2"  (1.575 m)    Wt 53.1 kg (117 lb)    LMP 04/20/2017    SpO2 100%    BMI 21.40 kg/m  If your blood pressure (BP) was elevated on multiple readings during this visit above 130 for the top number or above 80 for the bottom number, please have  this repeated by your primary care provider within one month. --------------  Thank you for allowing [place] to participate in your care today.

## 2017-06-30 ENCOUNTER — Emergency Department (HOSPITAL_COMMUNITY)
Admission: EM | Admit: 2017-06-30 | Discharge: 2017-06-30 | Disposition: A | Discharge status: Home / Self Care | Payer: Self-pay | Attending: Emergency Medicine | Admitting: Emergency Medicine

## 2017-06-30 ENCOUNTER — Encounter (HOSPITAL_COMMUNITY): Payer: Self-pay | Admitting: *Deleted

## 2017-06-30 ENCOUNTER — Other Ambulatory Visit: Payer: Self-pay

## 2017-06-30 ENCOUNTER — Emergency Department (HOSPITAL_COMMUNITY): Payer: Self-pay

## 2017-06-30 DIAGNOSIS — F17210 Nicotine dependence, cigarettes, uncomplicated: Secondary | ICD-10-CM | POA: Insufficient documentation

## 2017-06-30 DIAGNOSIS — Y939 Activity, unspecified: Secondary | ICD-10-CM | POA: Insufficient documentation

## 2017-06-30 DIAGNOSIS — Y92410 Unspecified street and highway as the place of occurrence of the external cause: Secondary | ICD-10-CM | POA: Insufficient documentation

## 2017-06-30 DIAGNOSIS — S022XXB Fracture of nasal bones, initial encounter for open fracture: Secondary | ICD-10-CM | POA: Insufficient documentation

## 2017-06-30 DIAGNOSIS — Y999 Unspecified external cause status: Secondary | ICD-10-CM | POA: Insufficient documentation

## 2017-06-30 DIAGNOSIS — Z23 Encounter for immunization: Secondary | ICD-10-CM | POA: Insufficient documentation

## 2017-06-30 MED ORDER — HYDROGEN PEROXIDE 3 % EX SOLN
CUTANEOUS | Status: AC
  Filled 2017-06-30: qty 473

## 2017-06-30 MED ORDER — NAPROXEN 500 MG PO TABS: 500.0000 mg | ORAL_TABLET | Freq: Two times a day (BID) | ORAL | 0 refills | Status: AC

## 2017-06-30 MED ORDER — OXYCODONE-ACETAMINOPHEN 5-325 MG PO TABS
1.0000 | ORAL_TABLET | Freq: Once | ORAL | Status: AC
  Administered 2017-06-30: 1 via ORAL
  Filled 2017-06-30: qty 1

## 2017-06-30 MED ORDER — TETANUS-DIPHTH-ACELL PERTUSSIS 5-2.5-18.5 LF-MCG/0.5 IM SUSP
0.5000 mL | Freq: Once | INTRAMUSCULAR | Status: AC
  Administered 2017-06-30: 0.5 mL via INTRAMUSCULAR
  Filled 2017-06-30: qty 0.5

## 2017-06-30 MED ORDER — LIDOCAINE-EPINEPHRINE (PF) 1 %-1:200000 IJ SOLN
20.0000 mL | Freq: Once | INTRAMUSCULAR | Status: AC
  Administered 2017-06-30: 20 mL
  Filled 2017-06-30: qty 30

## 2017-06-30 MED ORDER — CEPHALEXIN 500 MG PO CAPS: 500.0000 mg | ORAL_CAPSULE | Freq: Four times a day (QID) | ORAL | 0 refills | Status: AC

## 2017-06-30 MED ORDER — HYDROCODONE-ACETAMINOPHEN 5-325 MG PO TABS: 1.0000 | ORAL_TABLET | Freq: Four times a day (QID) | ORAL | 0 refills | Status: AC | PRN
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
## 2019-01-05 ENCOUNTER — Other Ambulatory Visit: Payer: Self-pay

## 2019-01-05 DIAGNOSIS — Z20822 Contact with and (suspected) exposure to covid-19: Secondary | ICD-10-CM

## 2019-01-07 LAB — NOVEL CORONAVIRUS, NAA: SARS-CoV-2, NAA: NOT DETECTED
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
